# Patient Record
Sex: Female | Born: 1985 | Race: White | Hispanic: No | Marital: Single | Smoking: Current every day smoker
Health system: Southern US, Community
[De-identification: ages and names within clinical notes are randomized; demographics above are authoritative.]

## PROBLEM LIST (undated history)

## (undated) DIAGNOSIS — G8929 Other chronic pain: Secondary | ICD-10-CM

## (undated) DIAGNOSIS — M549 Dorsalgia, unspecified: Secondary | ICD-10-CM

## (undated) DIAGNOSIS — F1910 Other psychoactive substance abuse, uncomplicated: Secondary | ICD-10-CM

---

## 2004-08-06 ENCOUNTER — Emergency Department (HOSPITAL_COMMUNITY): Admission: EM | Admit: 2004-08-06 | Discharge: 2004-08-07 | Payer: Self-pay | Admitting: Emergency Medicine

## 2009-04-19 ENCOUNTER — Emergency Department (HOSPITAL_BASED_OUTPATIENT_CLINIC_OR_DEPARTMENT_OTHER): Admission: EM | Admit: 2009-04-19 | Discharge: 2009-04-19 | Payer: Self-pay | Admitting: Emergency Medicine

## 2009-08-08 ENCOUNTER — Emergency Department (HOSPITAL_COMMUNITY): Admission: EM | Admit: 2009-08-08 | Discharge: 2009-08-08 | Payer: Self-pay | Admitting: Emergency Medicine

## 2009-08-09 ENCOUNTER — Emergency Department (HOSPITAL_BASED_OUTPATIENT_CLINIC_OR_DEPARTMENT_OTHER): Admission: EM | Admit: 2009-08-09 | Discharge: 2009-08-10 | Payer: Self-pay | Admitting: Emergency Medicine

## 2011-02-17 LAB — COMPREHENSIVE METABOLIC PANEL
ALT: 11 U/L (ref 0–35)
Albumin: 4.2 g/dL (ref 3.5–5.2)
Alkaline Phosphatase: 83 U/L (ref 39–117)
Chloride: 105 mEq/L (ref 96–112)
Glucose, Bld: 68 mg/dL — ABNORMAL LOW (ref 70–99)
Potassium: 3.4 mEq/L — ABNORMAL LOW (ref 3.5–5.1)
Sodium: 144 mEq/L (ref 135–145)
Total Bilirubin: 0.3 mg/dL (ref 0.3–1.2)
Total Protein: 7.3 g/dL (ref 6.0–8.3)

## 2011-02-17 LAB — DIFFERENTIAL
Basophils Absolute: 0 10*3/uL (ref 0.0–0.1)
Basophils Relative: 1 % (ref 0–1)
Basophils Relative: 0 % (ref 0–1)
Eosinophils Absolute: 0.4 10*3/uL (ref 0.0–0.7)
Eosinophils Absolute: 0.4 10*3/uL (ref 0.0–0.7)
Lymphs Abs: 2 10*3/uL (ref 0.7–4.0)
Monocytes Absolute: 0.6 10*3/uL (ref 0.1–1.0)
Monocytes Relative: 8 % (ref 3–12)
Neutrophils Relative %: 52 % (ref 43–77)
Neutrophils Relative %: 66 % (ref 43–77)

## 2011-02-17 LAB — CBC
HCT: 41.1 % (ref 36.0–46.0)
Hemoglobin: 14.2 g/dL (ref 12.0–15.0)
MCV: 95.3 fL (ref 78.0–100.0)
Platelets: 240 10*3/uL (ref 150–400)
RDW: 11.6 % (ref 11.5–15.5)
WBC: 7.4 10*3/uL (ref 4.0–10.5)
WBC: 8.6 10*3/uL (ref 4.0–10.5)

## 2011-02-17 LAB — POCT PREGNANCY, URINE: Preg Test, Ur: NEGATIVE

## 2011-02-17 LAB — RAPID URINE DRUG SCREEN, HOSP PERFORMED
Amphetamines: NOT DETECTED
Barbiturates: NOT DETECTED
Benzodiazepines: NOT DETECTED
Tetrahydrocannabinol: POSITIVE — AB

## 2011-02-17 LAB — URINE MICROSCOPIC-ADD ON

## 2011-02-17 LAB — BASIC METABOLIC PANEL
BUN: 9 mg/dL (ref 6–23)
Calcium: 9.4 mg/dL (ref 8.4–10.5)
Chloride: 105 mEq/L (ref 96–112)
Creatinine, Ser: 0.89 mg/dL (ref 0.4–1.2)

## 2011-02-17 LAB — URINALYSIS, ROUTINE W REFLEX MICROSCOPIC
Nitrite: NEGATIVE
Protein, ur: NEGATIVE mg/dL
Specific Gravity, Urine: 1.021 (ref 1.005–1.030)
Urobilinogen, UA: 1 mg/dL (ref 0.0–1.0)

## 2011-02-17 LAB — PREGNANCY, URINE: Preg Test, Ur: NEGATIVE

## 2011-02-17 LAB — ETHANOL
Alcohol, Ethyl (B): <5 mg/dL (ref 0–10)
Alcohol, Ethyl (B): <5 mg/dL (ref 0–10)

## 2011-02-17 LAB — POCT TOXICOLOGY PANEL: Tetrahydrocannabinol: POSITIVE

## 2011-02-20 LAB — DIFFERENTIAL
Lymphocytes Relative: 23 % (ref 12–46)
Lymphs Abs: 1.8 10*3/uL (ref 0.7–4.0)
Monocytes Relative: 6 % (ref 3–12)
Neutro Abs: 5.3 10*3/uL (ref 1.7–7.7)
Neutrophils Relative %: 69 % (ref 43–77)

## 2011-02-20 LAB — URINALYSIS, ROUTINE W REFLEX MICROSCOPIC
Glucose, UA: NEGATIVE mg/dL
Protein, ur: 30 mg/dL — AB
pH: 6 (ref 5.0–8.0)

## 2011-02-20 LAB — URINE MICROSCOPIC-ADD ON

## 2011-02-20 LAB — BASIC METABOLIC PANEL
Calcium: 10.4 mg/dL (ref 8.4–10.5)
Chloride: 103 mEq/L (ref 96–112)
Creatinine, Ser: 0.7 mg/dL (ref 0.4–1.2)
GFR calc Af Amer: >60 mL/min (ref >60)

## 2011-02-20 LAB — POCT TOXICOLOGY PANEL
Benzodiazepines: POSITIVE
Opiates: POSITIVE
Tetrahydrocannabinol: POSITIVE

## 2011-02-20 LAB — CBC
RBC: 5.18 MIL/uL — ABNORMAL HIGH (ref 3.87–5.11)
WBC: 7.7 10*3/uL (ref 4.0–10.5)

## 2011-02-20 LAB — ETHANOL: Alcohol, Ethyl (B): <10 mg/dL (ref 0–10)

## 2011-11-14 ENCOUNTER — Emergency Department (HOSPITAL_COMMUNITY)
Admission: EM | Admit: 2011-11-14 | Discharge: 2011-11-14 | Disposition: A | Discharge status: Home / Self Care | Payer: Self-pay

## 2011-11-14 NOTE — ED Notes (Signed)
Pt no answer for triage x3 

## 2012-04-09 ENCOUNTER — Emergency Department (HOSPITAL_COMMUNITY)
Admission: EM | Admit: 2012-04-09 | Discharge: 2012-04-09 | Disposition: A | Discharge status: Home / Self Care | Payer: Self-pay | Attending: Emergency Medicine | Admitting: Emergency Medicine

## 2012-04-09 ENCOUNTER — Encounter (HOSPITAL_COMMUNITY): Payer: Self-pay

## 2012-04-09 DIAGNOSIS — M549 Dorsalgia, unspecified: Secondary | ICD-10-CM | POA: Insufficient documentation

## 2012-04-09 DIAGNOSIS — M542 Cervicalgia: Secondary | ICD-10-CM | POA: Insufficient documentation

## 2012-04-09 HISTORY — DX: Dorsalgia, unspecified: M54.9

## 2012-04-09 HISTORY — DX: Other chronic pain: G89.29

## 2012-04-09 NOTE — ED Notes (Signed)
PA at bedside.
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
## 2012-04-10 NOTE — ED Provider Notes (Signed)
Medical screening examination/treatment/procedure(s) were performed by non-physician practitioner and as supervising physician I was immediately available for consultation/collaboration.  Quashawn Jewkes T Eisley Barber, MD 04/10/12 2319 

## 2014-04-29 ENCOUNTER — Emergency Department (HOSPITAL_COMMUNITY)
Admission: EM | Admit: 2014-04-29 | Discharge: 2014-04-30 | Disposition: A | Discharge status: Home / Self Care | Payer: Self-pay | Attending: Emergency Medicine | Admitting: Emergency Medicine

## 2014-04-29 ENCOUNTER — Emergency Department (HOSPITAL_COMMUNITY): Payer: Self-pay

## 2014-04-29 DIAGNOSIS — S92919A Unspecified fracture of unspecified toe(s), initial encounter for closed fracture: Secondary | ICD-10-CM | POA: Insufficient documentation

## 2014-04-29 DIAGNOSIS — S92912A Unspecified fracture of left toe(s), initial encounter for closed fracture: Secondary | ICD-10-CM

## 2014-04-29 DIAGNOSIS — Y92410 Unspecified street and highway as the place of occurrence of the external cause: Secondary | ICD-10-CM | POA: Insufficient documentation

## 2014-04-29 DIAGNOSIS — G8929 Other chronic pain: Secondary | ICD-10-CM | POA: Insufficient documentation

## 2014-04-29 DIAGNOSIS — S301XXA Contusion of abdominal wall, initial encounter: Secondary | ICD-10-CM | POA: Insufficient documentation

## 2014-04-29 DIAGNOSIS — Y9389 Activity, other specified: Secondary | ICD-10-CM | POA: Insufficient documentation

## 2014-04-29 DIAGNOSIS — S20219A Contusion of unspecified front wall of thorax, initial encounter: Secondary | ICD-10-CM | POA: Insufficient documentation

## 2014-04-29 DIAGNOSIS — IMO0002 Reserved for concepts with insufficient information to code with codable children: Secondary | ICD-10-CM | POA: Insufficient documentation

## 2014-04-29 DIAGNOSIS — T07XXXA Unspecified multiple injuries, initial encounter: Secondary | ICD-10-CM

## 2014-04-29 DIAGNOSIS — Z79899 Other long term (current) drug therapy: Secondary | ICD-10-CM | POA: Insufficient documentation

## 2014-04-29 LAB — CBC WITH DIFFERENTIAL/PLATELET
BASOS PCT: 0 % (ref 0–1)
Basophils Absolute: 0 10*3/uL (ref 0.0–0.1)
EOS ABS: 0.3 10*3/uL (ref 0.0–0.7)
Eosinophils Relative: 4 % (ref 0–5)
HCT: 37.5 % (ref 36.0–46.0)
Hemoglobin: 12.3 g/dL (ref 12.0–15.0)
LYMPHS ABS: 2.3 10*3/uL (ref 0.7–4.0)
Lymphocytes Relative: 25 % (ref 12–46)
MCH: 29.7 pg (ref 26.0–34.0)
MCHC: 32.8 g/dL (ref 30.0–36.0)
MCV: 90.6 fL (ref 78.0–100.0)
Monocytes Absolute: 0.9 10*3/uL (ref 0.1–1.0)
Monocytes Relative: 9 % (ref 3–12)
Neutro Abs: 5.6 10*3/uL (ref 1.7–7.7)
Neutrophils Relative %: 62 % (ref 43–77)
PLATELETS: 299 10*3/uL (ref 150–400)
RBC: 4.14 MIL/uL (ref 3.87–5.11)
RDW: 12.5 % (ref 11.5–15.5)
WBC: 9.2 10*3/uL (ref 4.0–10.5)

## 2014-04-29 NOTE — ED Notes (Signed)
MD at bedside. Wentz  
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
## 2014-04-29 NOTE — ED Notes (Signed)
Pt lying flat supine, alert, NAD, calm, interactive, skin W&D, resps e/u, speaking in clear complete sentences, VSS, no dyspnea noted, c-collar in place, [facility], pt to xray/CT.
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
## 2014-04-30 ENCOUNTER — Encounter (HOSPITAL_COMMUNITY): Payer: Self-pay | Admitting: Radiology

## 2014-04-30 ENCOUNTER — Emergency Department (HOSPITAL_COMMUNITY): Payer: No Typology Code available for payment source

## 2014-04-30 LAB — COMPREHENSIVE METABOLIC PANEL
ALT: 12 U/L (ref 0–35)
AST: 24 U/L (ref 0–37)
Albumin: 3.6 g/dL (ref 3.5–5.2)
Alkaline Phosphatase: 59 U/L (ref 39–117)
BUN: 10 mg/dL (ref 6–23)
CALCIUM: 9.5 mg/dL (ref 8.4–10.5)
CO2: 27 mEq/L (ref 19–32)
Chloride: 103 mEq/L (ref 96–112)
Creatinine, Ser: 0.87 mg/dL (ref 0.50–1.10)
GFR calc Af Amer: >90 mL/min (ref >90)
GFR calc non Af Amer: 89 mL/min — ABNORMAL LOW (ref >90)
Glucose, Bld: 94 mg/dL (ref 70–99)
Potassium: 3.9 mEq/L (ref 3.7–5.3)
SODIUM: 143 meq/L (ref 137–147)
TOTAL PROTEIN: 7.1 g/dL (ref 6.0–8.3)
Total Bilirubin: 0.3 mg/dL (ref 0.3–1.2)

## 2014-04-30 LAB — HCG, SERUM, QUALITATIVE: PREG SERUM: NEGATIVE

## 2014-04-30 MED ORDER — TRAMADOL HCL 50 MG PO TABS: 50.0000 mg | ORAL_TABLET | Freq: Four times a day (QID) | ORAL | Status: DC | PRN

## 2014-04-30 MED ORDER — IOHEXOL 300 MG/ML  SOLN
80.0000 mL | Freq: Once | INTRAMUSCULAR | Status: AC | PRN
  Administered 2014-04-30: 80 mL via INTRAVENOUS

## 2014-04-30 MED ORDER — IBUPROFEN 600 MG PO TABS: 600.0000 mg | ORAL_TABLET | Freq: Four times a day (QID) | ORAL | Status: AC | PRN

## 2014-04-30 MED ORDER — SODIUM CHLORIDE 0.9 % IV SOLN
Freq: Once | INTRAVENOUS | Status: AC
  Administered 2014-04-30: 20 mL/h via INTRAVENOUS

## 2014-04-30 NOTE — ED Notes (Signed)
Tube station down, red top tube walked to lab, lab contacted via phone and notified.

## 2014-04-30 NOTE — ED Notes (Signed)
Back from xray, no changes, continues to c/o leg, bag, neck and foot pain. 8/10. VSS. Pending imaging results. Remains flat, supine with c-collar.

## 2014-04-30 NOTE — ED Notes (Signed)
CT contacted, HCG negative, pt ready.

## 2014-04-30 NOTE — ED Notes (Signed)
Lab called and notified ED "not able to add-on Serum HCG".

## 2014-04-30 NOTE — ED Notes (Signed)
Lab confirmed can add on serum HCG
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
## 2014-04-30 NOTE — ED Notes (Signed)
Pt to CT, no changes, VSS.

## 2014-04-30 NOTE — ED Notes (Signed)
Pt stated that she did not want the prescriptions provided by the MD.  Pt stated that they would not help her in the slightest.
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
## 2014-04-30 NOTE — ED Notes (Addendum)
Alert, NAD, calm, interactive, resting lying supine, c-collar in place, VSS.  Need for urine sample explained. Placed on BP.

## 2014-04-30 NOTE — ED Notes (Signed)
Unable to draw blood from IV, 2d IV started, blood obtained and sent to lab.

## 2015-05-19 DIAGNOSIS — F1910 Other psychoactive substance abuse, uncomplicated: Secondary | ICD-10-CM | POA: Insufficient documentation

## 2015-05-19 NOTE — ED Notes (Signed)
Pt in states wants opiate  detox states also benzos denies any SI or HI.

## 2015-05-20 ENCOUNTER — Emergency Department
Admission: EM | Admit: 2015-05-20 | Discharge: 2015-05-20 | Discharge status: Against Medical Advice | Payer: Self-pay | Attending: Emergency Medicine | Admitting: Emergency Medicine

## 2015-05-20 DIAGNOSIS — F1910 Other psychoactive substance abuse, uncomplicated: Secondary | ICD-10-CM

## 2015-05-20 NOTE — ED Provider Notes (Signed)
I Never saw this patient, patient left without being seen.  Mindy BaltimoreLinda Ofelia Soto M Mindy Leaman, MD 05/20/15 [phone]
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
## 2016-04-19 ENCOUNTER — Encounter (HOSPITAL_COMMUNITY): Payer: Self-pay

## 2016-04-19 ENCOUNTER — Emergency Department (HOSPITAL_COMMUNITY)
Admission: EM | Admit: 2016-04-19 | Discharge: 2016-04-19 | Disposition: A | Discharge status: Home / Self Care | Payer: No Typology Code available for payment source | Attending: Emergency Medicine | Admitting: Emergency Medicine

## 2016-04-19 DIAGNOSIS — T401X1A Poisoning by heroin, accidental (unintentional), initial encounter: Secondary | ICD-10-CM | POA: Insufficient documentation

## 2016-04-19 DIAGNOSIS — F17210 Nicotine dependence, cigarettes, uncomplicated: Secondary | ICD-10-CM | POA: Insufficient documentation

## 2016-04-19 HISTORY — DX: Other psychoactive substance abuse, uncomplicated: F19.10

## 2016-04-19 MED ORDER — SULFAMETHOXAZOLE-TRIMETHOPRIM 800-160 MG PO TABS: 1.0000 | ORAL_TABLET | Freq: Two times a day (BID) | ORAL | Status: AC

## 2016-04-19 NOTE — ED Provider Notes (Signed)
CSN: [id number]     Arrival date & time 04/19/16  1204 History   First MD Initiated Contact with Patient 04/19/16 1209     Chief Complaint  Patient presents with  . Drug Overdose    AAOX4     HPI Patient presents to the emergency department after an unintentional heroin overdose that led to her becoming unresponsive and receiving Narcan from the police department.  She also reports over the past several days she thinks she may have had several seizures.  She does report a history of seizures several years ago and never had neurology follow-up.  She is not on seizure medications.  She reports no seizure today.  She denies headache.  She denies fevers and chills.  She states that she has had an abscess to her right upper arm from an injection site that is improving and decreasing in size.  She denies spreading redness or drainage from her right upper arm.  She injected today into her right dorsum of her hand.    Past Medical History  Diagnosis Date  . Back pain, chronic   . Drug abuse    History reviewed. No pertinent past surgical history. History reviewed. No pertinent family history. Social History  Substance Use Topics  . Smoking status: Current Every Day Smoker -- 0.50 packs/day    Types: Cigarettes  . Smokeless tobacco: None  . Alcohol Use: Yes     Comment: OCCASIONAL   OB History    No data available     Review of Systems  All other systems reviewed and are negative.     Allergies  Review of patient's allergies indicates no known allergies.  Home Medications   Prior to Admission medications   Medication Sig Start Date End Date Taking? Authorizing Provider  amitriptyline (ELAVIL) 100 MG tablet Take 100 mg by mouth at bedtime.    Historical Provider, MD  gabapentin (NEURONTIN) 300 MG capsule Take 300 mg by mouth 3 (three) times daily.    Historical Provider, MD  ibuprofen (ADVIL,MOTRIN) 200 MG tablet Take 200 mg by mouth every 6 (six) hours as needed. For pain     Historical Provider, MD  ibuprofen (ADVIL,MOTRIN) 600 MG tablet Take 1 tablet (600 mg total) by mouth every 6 (six) hours as needed. 04/30/14   Derwood Kaplan, MD  methadone (DOLOPHINE) 10 MG tablet Take 30 mg by mouth every 4 (four) hours.    Historical Provider, MD  Oxycodone HCl 20 MG TABS Take 20 mg by mouth every 6 (six) hours as needed (pain).    Historical Provider, MD  traMADol (ULTRAM) 50 MG tablet Take 1 tablet (50 mg total) by mouth every 6 (six) hours as needed. 04/30/14   Ankit Nanavati, MD   BP 140/83 mmHg  Pulse 110  Temp(Src) 98.8 F (37.1 C) (Oral)  Resp 15  Ht  (1.727 m)  Wt 143 lb (64.864 kg)  BMI 21.75 kg/m2  SpO2 97%  LMP 04/13/2016 Physical Exam  Constitutional: She is oriented to person, place, and time. She appears well-developed and well-nourished.  HENT:  Head: Normocephalic and atraumatic.  Eyes: EOM are normal. Pupils are equal, round, and reactive to light.  Neck: Normal range of motion.  Cardiovascular: Regular rhythm.   Pulmonary/Chest: Effort normal.  Abdominal: Soft. She exhibits no distension.  Musculoskeletal: Normal range of motion.  Superior to right antecubital fossa there does appear to be a small area of erythema without obvious fluctuance.  No drainage at this time.  Neurological: She is alert and oriented to person, place, and time.  5/5 strength in major muscle groups of  bilateral upper and lower extremities. Speech normal. No facial asymetry.   Psychiatric: She has a normal mood and affect.  Nursing note and vitals reviewed.   ED Course  Procedures (including critical care time) Labs Review Labs Reviewed - No data to display  Imaging Review No results found. I have personally reviewed and evaluated these images and lab results as part of my medical decision-making.   EKG Interpretation None      MDM   Final diagnoses:  None    Patient observed in the emergency department no need for repeat Narcan.  Patient reports  that she does have Narcan at home and normally has it with her but did not have it with her today.  In regards to her right upper extremity erythema.  There likely was a abscess that now seems to be improving.  There is no drainage.  She'll be placed on Bactrim for this with ongoing warm compresses.  For her possible seizures she will be given outpatient neurology follow-up.  Nonfocal neuro exam at this time.  No indication for labs or imaging.  I recommended cessation of her ongoing opioid abuse    Azalia BilisKevin Eowyn Tabone, MD 04/19/16 1313

## 2016-04-19 NOTE — ED Notes (Signed)
PT DISCHARGED. INSTRUCTIONS AND PRESCRIPTION GIVEN. AAOX4. PT IN NO APPARENT DISTRESS. THE OPPORTUNITY TO ASK QUESTIONS WAS PROVIDED. 

## 2016-04-19 NOTE — ED Notes (Signed)
Bed: WA21 Expected date:  Expected time:  Means of arrival:  Comments: Ems - 31, heroin od, awake currently
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
## 2016-04-19 NOTE — Discharge Instructions (Signed)
Narcotic Overdose °A narcotic overdose is the misuse or overuse of a narcotic drug. A narcotic overdose can make you pass out and stop breathing. If you are not treated right away, this can cause permanent brain damage or stop your heart. Medicine may be given to reverse the effects of an overdose. If so, this medicine may bring on withdrawal symptoms. The symptoms may be abdominal cramps, throwing up (vomiting), sweating, chills, and nervousness. °Injecting narcotics can cause more problems than just an overdose. AIDS, hepatitis, and other very serious infections are transmitted by sharing needles and syringes. If you decide to quit using, there are medicines which can help you through the withdrawal period. Trying to quit all at once on your own can be uncomfortable, but not life-threatening. Call your caregiver, Narcotics Anonymous, or any drug and alcohol treatment program for further help.  °  °This information is not intended to replace advice given to you by your health care provider. Make sure you discuss any questions you have with your health care provider. °  °Document Released: 12/07/2004 Document Revised: 11/20/2014 Document Reviewed: 04/21/2015 °Elsevier Interactive Patient Education ©2016 [employer] ° °

## 2016-12-21 ENCOUNTER — Emergency Department (HOSPITAL_BASED_OUTPATIENT_CLINIC_OR_DEPARTMENT_OTHER)
Admission: EM | Admit: 2016-12-21 | Discharge: 2017-01-11 | Disposition: E | Payer: Self-pay | Attending: Emergency Medicine | Admitting: Emergency Medicine

## 2016-12-21 DIAGNOSIS — T50901A Poisoning by unspecified drugs, medicaments and biological substances, accidental (unintentional), initial encounter: Secondary | ICD-10-CM | POA: Insufficient documentation

## 2016-12-21 DIAGNOSIS — I469 Cardiac arrest, cause unspecified: Secondary | ICD-10-CM | POA: Insufficient documentation

## 2016-12-21 DIAGNOSIS — F17210 Nicotine dependence, cigarettes, uncomplicated: Secondary | ICD-10-CM | POA: Insufficient documentation

## 2016-12-21 LAB — CBG MONITORING, ED: GLUCOSE-CAPILLARY: 209 mg/dL — AB (ref 65–99)

## 2016-12-21 MED ORDER — NALOXONE HCL 2 MG/2ML IJ SOSY
PREFILLED_SYRINGE | INTRAMUSCULAR | Status: AC | PRN
  Administered 2016-12-21 (×4): 2 mg via INTRAVENOUS

## 2016-12-21 MED ORDER — SODIUM BICARBONATE 8.4 % IV SOLN
INTRAVENOUS | Status: AC | PRN
  Administered 2016-12-21: 50 meq via INTRAVENOUS

## 2016-12-21 MED ORDER — EPINEPHRINE PF 1 MG/10ML IJ SOSY
PREFILLED_SYRINGE | INTRAMUSCULAR | Status: AC | PRN
  Administered 2016-12-21 (×2): 1 mg via INTRAVENOUS

## 2016-12-21 MED ORDER — NALOXONE HCL 2 MG/2ML IJ SOSY
PREFILLED_SYRINGE | INTRAMUSCULAR | Status: AC | PRN
  Administered 2016-12-21: 2 mg via INTRAVENOUS

## 2016-12-21 MED FILL — Medication: Qty: 1 | Status: AC

## 2017-01-11 NOTE — ED Notes (Signed)
[place] [employer] called

## 2017-01-11 NOTE — ED Provider Notes (Signed)
[facility] Provider Note   CSN: [id number] Arrival date & time: 24-Dec-2016  0448     History   Chief Complaint No chief complaint on file.  LEVEL 5 caveat secondary to unresponsive in cardiac arrest  HPI Mindy Soto is a 31 y.o. female.  The history is provided by a friend. The history is limited by the condition of the patient.  Ingestion  This is a new problem. The current episode started 1 to 2 hours ago. The problem occurs constantly. The problem has not changed since onset.Pertinent negatives include no chest pain. Nothing aggravates the symptoms. Nothing relieves the symptoms. She has tried nothing for the symptoms. The treatment provided no relief.  Patient with a PMH of cocaine and opioid/heroin abuse and overdoses presents following being pulled from a car cyanotic and in cardiopulmonary arrest. Brought in by a friend in a vehicle last seen normal in bed more than an hour prior to arrival.  Friend reports heroin use at home.  Injection marks on B hands.   Found to have pills in plastic wrap in left axilla when transferred to a gurney.   Past Medical History:  Diagnosis Date  . Back pain, chronic   . Drug abuse     There are no active problems to display for this patient.   No past surgical history on file.  OB History    No data available       Home Medications    Prior to Admission medications   Medication Sig Start Date End Date Taking? Authorizing Provider  amitriptyline (ELAVIL) 100 MG tablet Take 100 mg by mouth at bedtime.    Historical Provider, MD  gabapentin (NEURONTIN) 300 MG capsule Take 300 mg by mouth 3 (three) times daily.    Historical Provider, MD  ibuprofen (ADVIL,MOTRIN) 200 MG tablet Take 200 mg by mouth every 6 (six) hours as needed. For pain    Historical Provider, MD  ibuprofen (ADVIL,MOTRIN) 600 MG tablet Take 1 tablet (600 mg total) by mouth every 6 (six) hours as needed. 04/30/14   Derwood Kaplan, MD    Family History No  family history on file.  Social History Social History  Substance Use Topics  . Smoking status: Current Every Day Smoker    Packs/day: 0.50    Types: Cigarettes  . Smokeless tobacco: Not on file  . Alcohol use Yes     Comment: OCCASIONAL     Allergies   Patient has no known allergies.   Review of Systems Review of Systems  Unable to perform ROS: Acuity of condition  Cardiovascular: Negative for chest pain.     Physical Exam Updated Vital Signs There were no vitals taken for this visit.  Physical Exam  Constitutional: She appears well-developed. She is intubated.  HENT:  Head: Normocephalic and atraumatic.  Mouth/Throat: No oropharyngeal exudate.  Eyes: Conjunctivae are normal.  Pinpoint pupils  Neck: No tracheal deviation present.  Cardiovascular: Exam reveals decreased pulses.   No pulse no distal pulses  Pulmonary/Chest: No stridor. She is intubated.  Rhonchi B with bagging post intubation  Abdominal: Soft. She exhibits no distension. Bowel sounds are absent.  Musculoskeletal: She exhibits no edema.  Neurological: She is unresponsive. GCS eye subscore is 1. GCS verbal subscore is 1. GCS motor subscore is 1.  Skin: Skin is dry. Capillary refill takes more than 3 seconds.     Cool and cyanotic.  Old track marks on BUE  Psychiatric:  Unable due to  GCS3     ED Treatments / Results  There were no vitals filed for this visit. Glucose, Bld  Date Value Ref Range Status  04/29/2014 94 70 - 99 mg/dL Final  16/10/960409/27/2010 68 (L) 70 - 99 mg/dL Final  54/09/811909/26/2010 98 70 - 99 mg/dL Final  14/78/295606/05/2009 213108 (H) 70 - 99 mg/dL Final     Procedures Procedure Name: Intubation Date/Time: 01/04/2017 5:16 AM Performed by: Cy BlamerPALUMBO, Tanairi Cypert Pre-anesthesia Checklist: Patient identified Oxygen Delivery Method: Ambu bag Preoxygenation: Pre-oxygenation with 100% oxygen Intubation Type: Cricoid Pressure applied Ventilation: Mask ventilation without difficulty Laryngoscope Size:  Glidescope and 4 Grade View: Grade II Tube size: 7.5 mm Number of attempts: 2 Placement Confirmation: ETT inserted through vocal cords under direct vision,  Positive ETCO2,  CO2 detector and Breath sounds checked- equal and bilateral Secured at: 22 cm Tube secured with: ETT holder Dental Injury: Teeth and Oropharynx as per pre-operative assessment       (including critical care time)  Intubated without drugs, cords open.    Medications Ordered in ED:  2 vials of epinephrine  1 amp of bicarbonate 12 mg of narcan      Continuous CPR in the ED from the time of arrival.  Patient continued to be in asystole on the monitor and pulseless and cyanotic.  Unable to restore circulation despite 2 rounds of EPINEPHRINE and 12 mg narcan and continuous CPR and an amp of bicarbonate due to prolonged down time.  Continuoued cyanosis and pulselessness with continuoed asystole on the monitor with high quality CPR. No shockable rhythm on the monitor.  Time of death 5:06 am   DEA database reviewed.  Patient received Suboxone on 07/19/2016 6 doses.    Final Clinical Impressions(s) / ED Diagnoses  Cardiac arrest secondary to overdose:  Medical examiner notified, accepted the patient.  Will transfer the patient with all IVs and tubes in place [facility]         Cy BlamerApril Darshay Deupree, MD 10/21/17 [phone]

## 2017-01-11 NOTE — ED Notes (Signed)
Mindy Soto notified will come in

## 2017-01-11 NOTE — Code Documentation (Signed)
Family reached via telephone and are on their way

## 2017-01-11 NOTE — ED Notes (Signed)
Pills that came out of patient's bra are given to [employer] at this time

## 2017-01-11 NOTE — ED Notes (Signed)
[place] [employer] have notified [employer] [employer] and they are on their way

## 2017-01-11 NOTE — Progress Notes (Signed)
Pt intubated at 0454 by Dr. Randal Buba using a MAC 3 glidescope with a 7.5 ETT secured at 24cm. BBS rhonchi, good chest rise, condensation, and pulmonary edema present. ETT left in place after time of death called.

## 2017-01-11 NOTE — ED Notes (Signed)
Notified [facility], pt is not suitable for donation Spoke with Jonetta OsgoodJamie Lane Referral number: [id number]

## 2017-01-11 NOTE — Code Documentation (Signed)
Patient time of death occurred at [id number]

## 2017-01-11 NOTE — ED Notes (Signed)
Family in conference room

## 2017-01-11 NOTE — ED Triage Notes (Signed)
Pt blue and unresponsive in the back of friends car with suspected overdose, CPR started

## 2017-01-11 NOTE — ED Notes (Signed)
Parents information:  Lawson FiscalLori and Reggy EyeSteve Gage [address] [place], [place]  [id number] [phone] (mom) [phone] (dad)

## 2017-01-11 DEATH — deceased
